# Patient Record
Sex: Male | Born: 1940 | Race: White | Hispanic: No | Marital: Married | Smoking: Light tobacco smoker
Health system: Southern US, Community
[De-identification: ages and names within clinical notes are randomized; demographics above are authoritative.]

## PROBLEM LIST (undated history)

## (undated) DIAGNOSIS — M549 Dorsalgia, unspecified: Secondary | ICD-10-CM

## (undated) DIAGNOSIS — R791 Abnormal coagulation profile: Secondary | ICD-10-CM

## (undated) DIAGNOSIS — L905 Scar conditions and fibrosis of skin: Secondary | ICD-10-CM

## (undated) DIAGNOSIS — D759 Disease of blood and blood-forming organs, unspecified: Secondary | ICD-10-CM

## (undated) DIAGNOSIS — M2550 Pain in unspecified joint: Secondary | ICD-10-CM

## (undated) DIAGNOSIS — R634 Abnormal weight loss: Secondary | ICD-10-CM

## (undated) DIAGNOSIS — J349 Unspecified disorder of nose and nasal sinuses: Secondary | ICD-10-CM

## (undated) DIAGNOSIS — R233 Spontaneous ecchymoses: Secondary | ICD-10-CM

## (undated) DIAGNOSIS — H9190 Unspecified hearing loss, unspecified ear: Secondary | ICD-10-CM

## (undated) DIAGNOSIS — R630 Anorexia: Secondary | ICD-10-CM

## (undated) DIAGNOSIS — I519 Heart disease, unspecified: Secondary | ICD-10-CM

## (undated) DIAGNOSIS — R350 Frequency of micturition: Secondary | ICD-10-CM

## (undated) DIAGNOSIS — I10 Essential (primary) hypertension: Secondary | ICD-10-CM

## (undated) DIAGNOSIS — Z8739 Personal history of other diseases of the musculoskeletal system and connective tissue: Secondary | ICD-10-CM

## (undated) DIAGNOSIS — R110 Nausea: Secondary | ICD-10-CM

## (undated) DIAGNOSIS — R262 Difficulty in walking, not elsewhere classified: Secondary | ICD-10-CM

## (undated) DIAGNOSIS — R531 Weakness: Secondary | ICD-10-CM

## (undated) DIAGNOSIS — I517 Cardiomegaly: Secondary | ICD-10-CM

## (undated) DIAGNOSIS — N429 Disorder of prostate, unspecified: Secondary | ICD-10-CM

## (undated) DIAGNOSIS — I509 Heart failure, unspecified: Secondary | ICD-10-CM

## (undated) DIAGNOSIS — C229 Malignant neoplasm of liver, not specified as primary or secondary: Secondary | ICD-10-CM

## (undated) DIAGNOSIS — R238 Other skin changes: Secondary | ICD-10-CM

## (undated) DIAGNOSIS — C801 Malignant (primary) neoplasm, unspecified: Secondary | ICD-10-CM

## (undated) HISTORY — DX: Spontaneous ecchymoses: R23.3

## (undated) HISTORY — DX: Heart disease, unspecified: I51.9

## (undated) HISTORY — DX: Malignant (primary) neoplasm, unspecified: C80.1

## (undated) HISTORY — DX: Malignant neoplasm of liver, not specified as primary or secondary: C22.9

## (undated) HISTORY — DX: Scar conditions and fibrosis of skin: L90.5

## (undated) HISTORY — DX: Heart failure, unspecified: I50.9

## (undated) HISTORY — DX: Disorder of prostate, unspecified: N42.9

## (undated) HISTORY — DX: Difficulty in walking, not elsewhere classified: R26.2

## (undated) HISTORY — DX: Abnormal weight loss: R63.4

## (undated) HISTORY — DX: Nausea: R11.0

## (undated) HISTORY — DX: Personal history of other diseases of the musculoskeletal system and connective tissue: Z87.39

## (undated) HISTORY — DX: Anorexia: R63.0

## (undated) HISTORY — PX: CARDIAC SURGERY: SHX584

## (undated) HISTORY — DX: Weakness: R53.1

## (undated) HISTORY — DX: Pain in unspecified joint: M25.50

## (undated) HISTORY — DX: Abnormal coagulation profile: R79.1

## (undated) HISTORY — DX: Other skin changes: R23.8

## (undated) HISTORY — DX: Disease of blood and blood-forming organs, unspecified: D75.9

## (undated) HISTORY — DX: Essential (primary) hypertension: I10

## (undated) HISTORY — DX: Cardiomegaly: I51.7

## (undated) HISTORY — PX: CARDIAC PACEMAKER PLACEMENT: SHX583

## (undated) HISTORY — DX: Unspecified hearing loss, unspecified ear: H91.90

## (undated) HISTORY — DX: Frequency of micturition: R35.0

## (undated) HISTORY — DX: Dorsalgia, unspecified: M54.9

## (undated) HISTORY — DX: Unspecified disorder of nose and nasal sinuses: J34.9

## (undated) HISTORY — PX: OTHER SURGICAL HISTORY: SHX169

---

## 2003-05-21 ENCOUNTER — Ambulatory Visit (HOSPITAL_COMMUNITY): Admission: RE | Admit: 2003-05-21 | Discharge: 2003-05-22 | Payer: Self-pay | Admitting: Internal Medicine

## 2003-08-27 ENCOUNTER — Inpatient Hospital Stay (HOSPITAL_COMMUNITY): Admission: RE | Admit: 2003-08-27 | Discharge: 2003-08-28 | Payer: Self-pay | Admitting: Internal Medicine

## 2003-11-12 ENCOUNTER — Ambulatory Visit: Payer: Self-pay

## 2004-01-21 ENCOUNTER — Ambulatory Visit: Payer: Self-pay | Admitting: Internal Medicine

## 2004-05-17 ENCOUNTER — Ambulatory Visit: Payer: Self-pay | Admitting: Internal Medicine

## 2004-10-11 ENCOUNTER — Ambulatory Visit: Payer: Self-pay

## 2005-02-22 ENCOUNTER — Ambulatory Visit: Payer: Self-pay | Admitting: Internal Medicine

## 2005-04-14 IMAGING — CR DG CHEST 2V
2 series · 2 of 2 positions shown · non-contrast
Comparison: none

CLINICAL DATA: Chest pain.  Smoker 1.5 packs per day for 56 years.  Precardiac catheterization. 
 CHEST (TWO VIEWS)
 PA and lateral views of the chest without previous films for comparison shows moderately severe chronic obstructive pulmonary disease with peribronchial thickening, flattening of the hemidiaphragms and increased AP diameter of the chest.   The heart is minimally prominent.  There is a dual lead pacer system which appears to be intact.  No evidence of active infiltrate, consolidation, pleural effusion or pneumothorax.  The bones appear to be within normal limits. 
 IMPRESSION
 Mild cardiomegaly, intact pacemaker system.  No evidence of acute disease.

[view not recorded (1 of 2)]
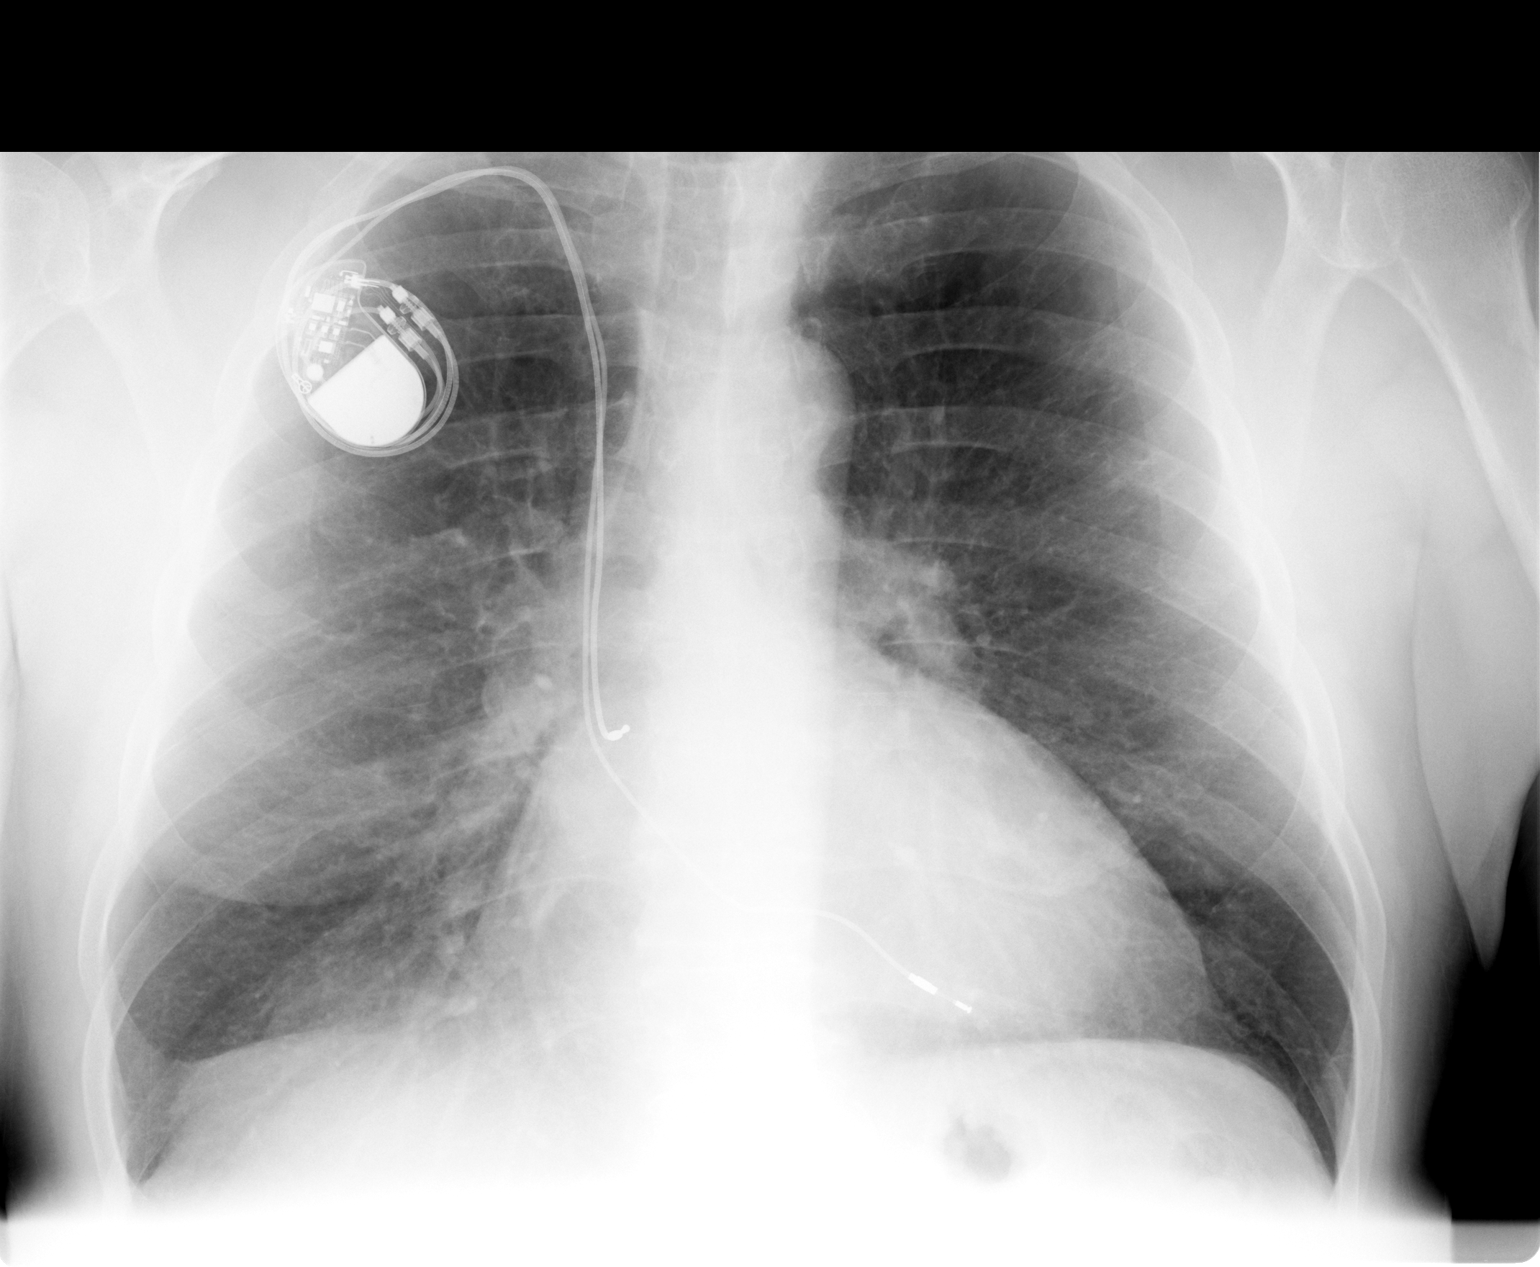

[view not recorded (2 of 2)]
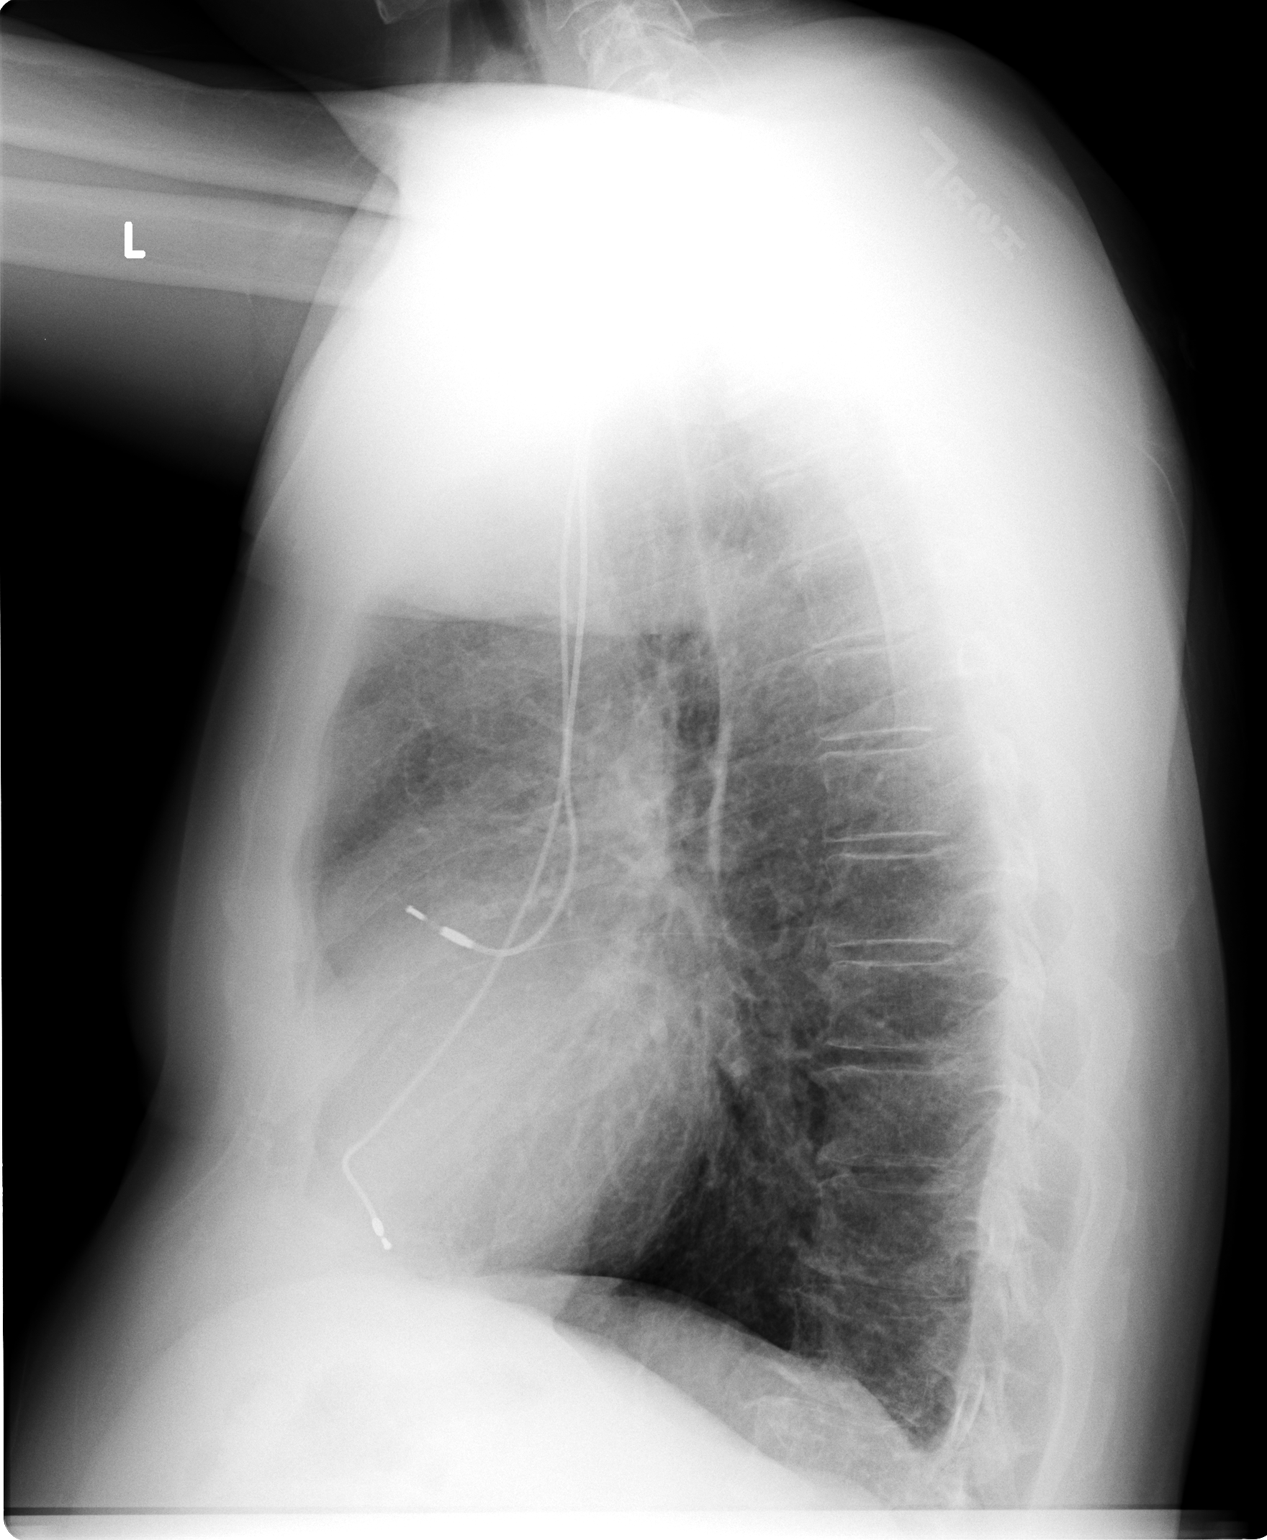

[2 of 2 positions shown; findings below may reference images not displayed]

## 2005-05-26 ENCOUNTER — Ambulatory Visit: Payer: Self-pay | Admitting: Internal Medicine

## 2005-07-21 IMAGING — CR DG CHEST 2V
2 series · 2 of 2 positions shown · non-contrast
Comparison: none

CLINICAL DATA: Left ventricular dysfunction.  
 CHEST (TWO VIEWS):
 Comparison 05/21/03.
 There is cardiomegaly present.  There is a permanent pacemaker with right atrial and right ventricular leads present which appear radiographically intact.  There are no infiltrative or edematous changes and there are no mediastinal abnormalities.
 IMPRESSION
 Cardiomegaly.  No evidence for active chest disease.

[view not recorded (1 of 2)]
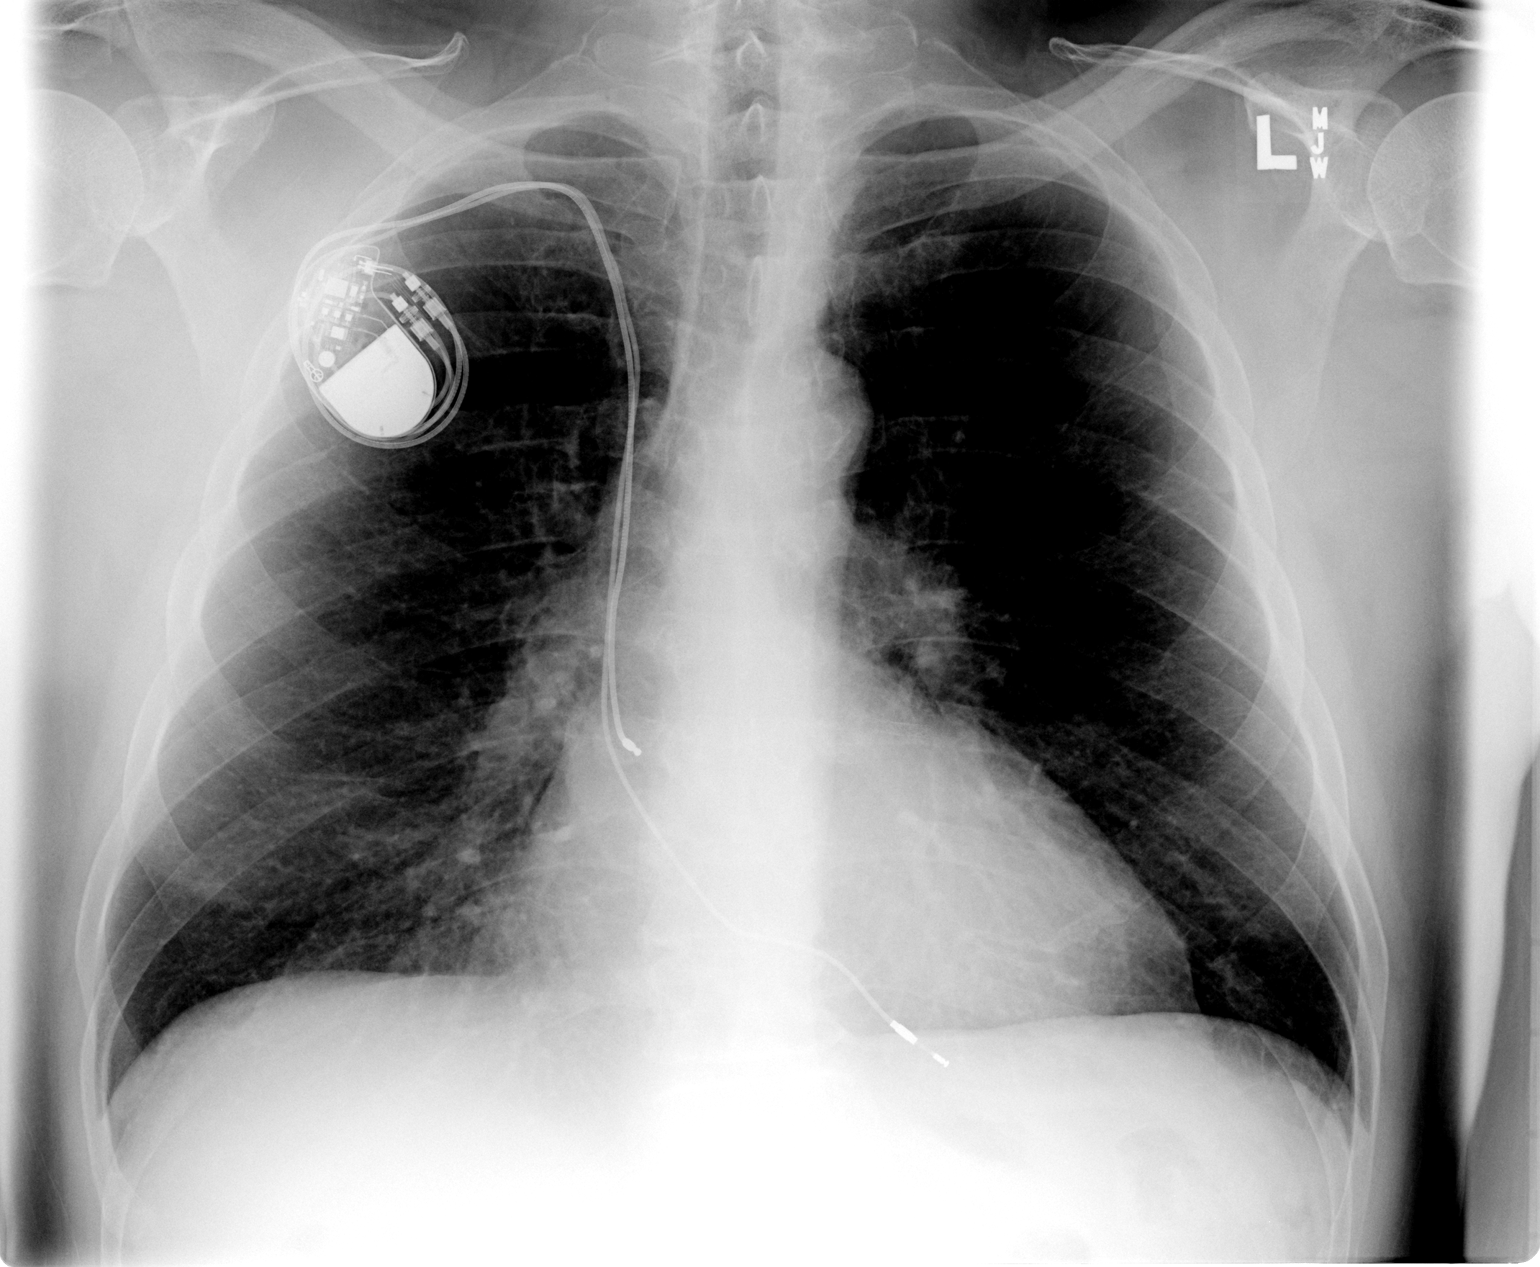

[view not recorded (2 of 2)]
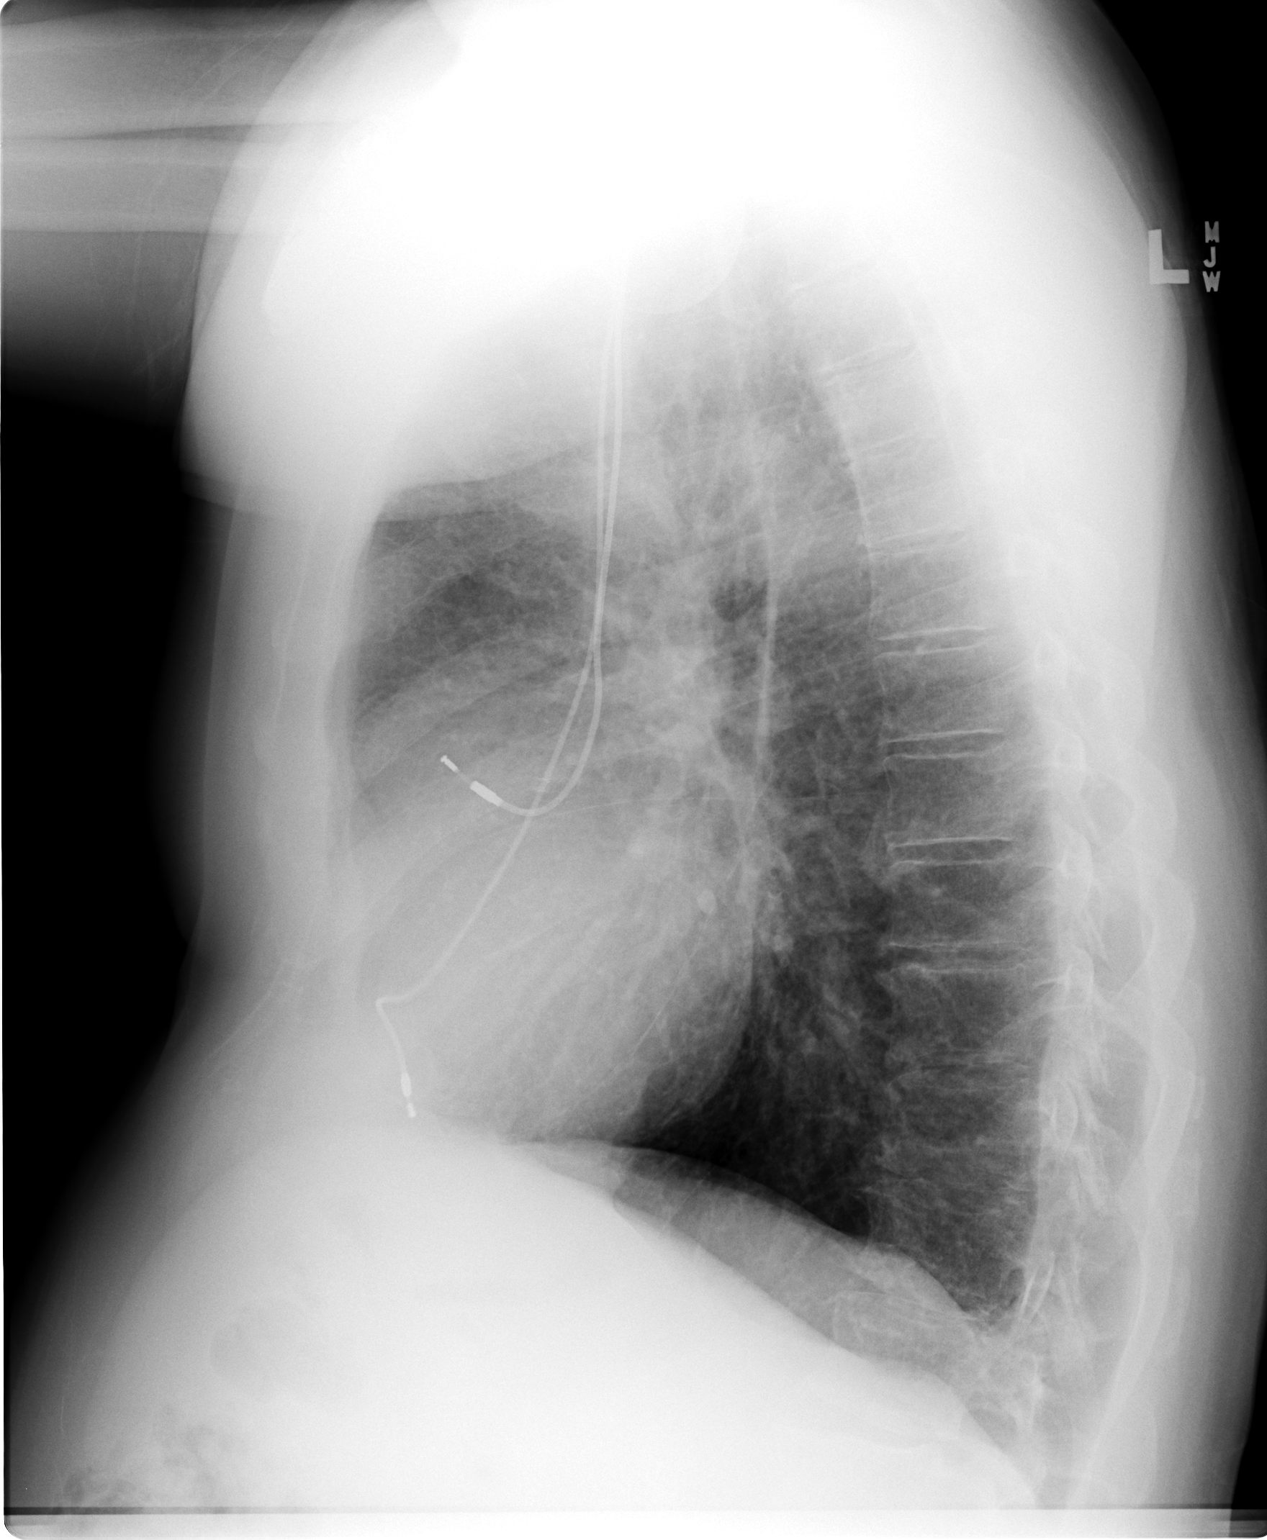

[2 of 2 positions shown; findings below may reference images not displayed]

## 2005-07-22 IMAGING — CR DG CHEST 1V PORT
1 series · 1 of 1 positions shown · non-contrast
Comparison: 08/25/03.

CLINICAL DATA: status post pacer revision.
 PORTABLE CHEST ONE VIEW (4824 hours)

[view not recorded]
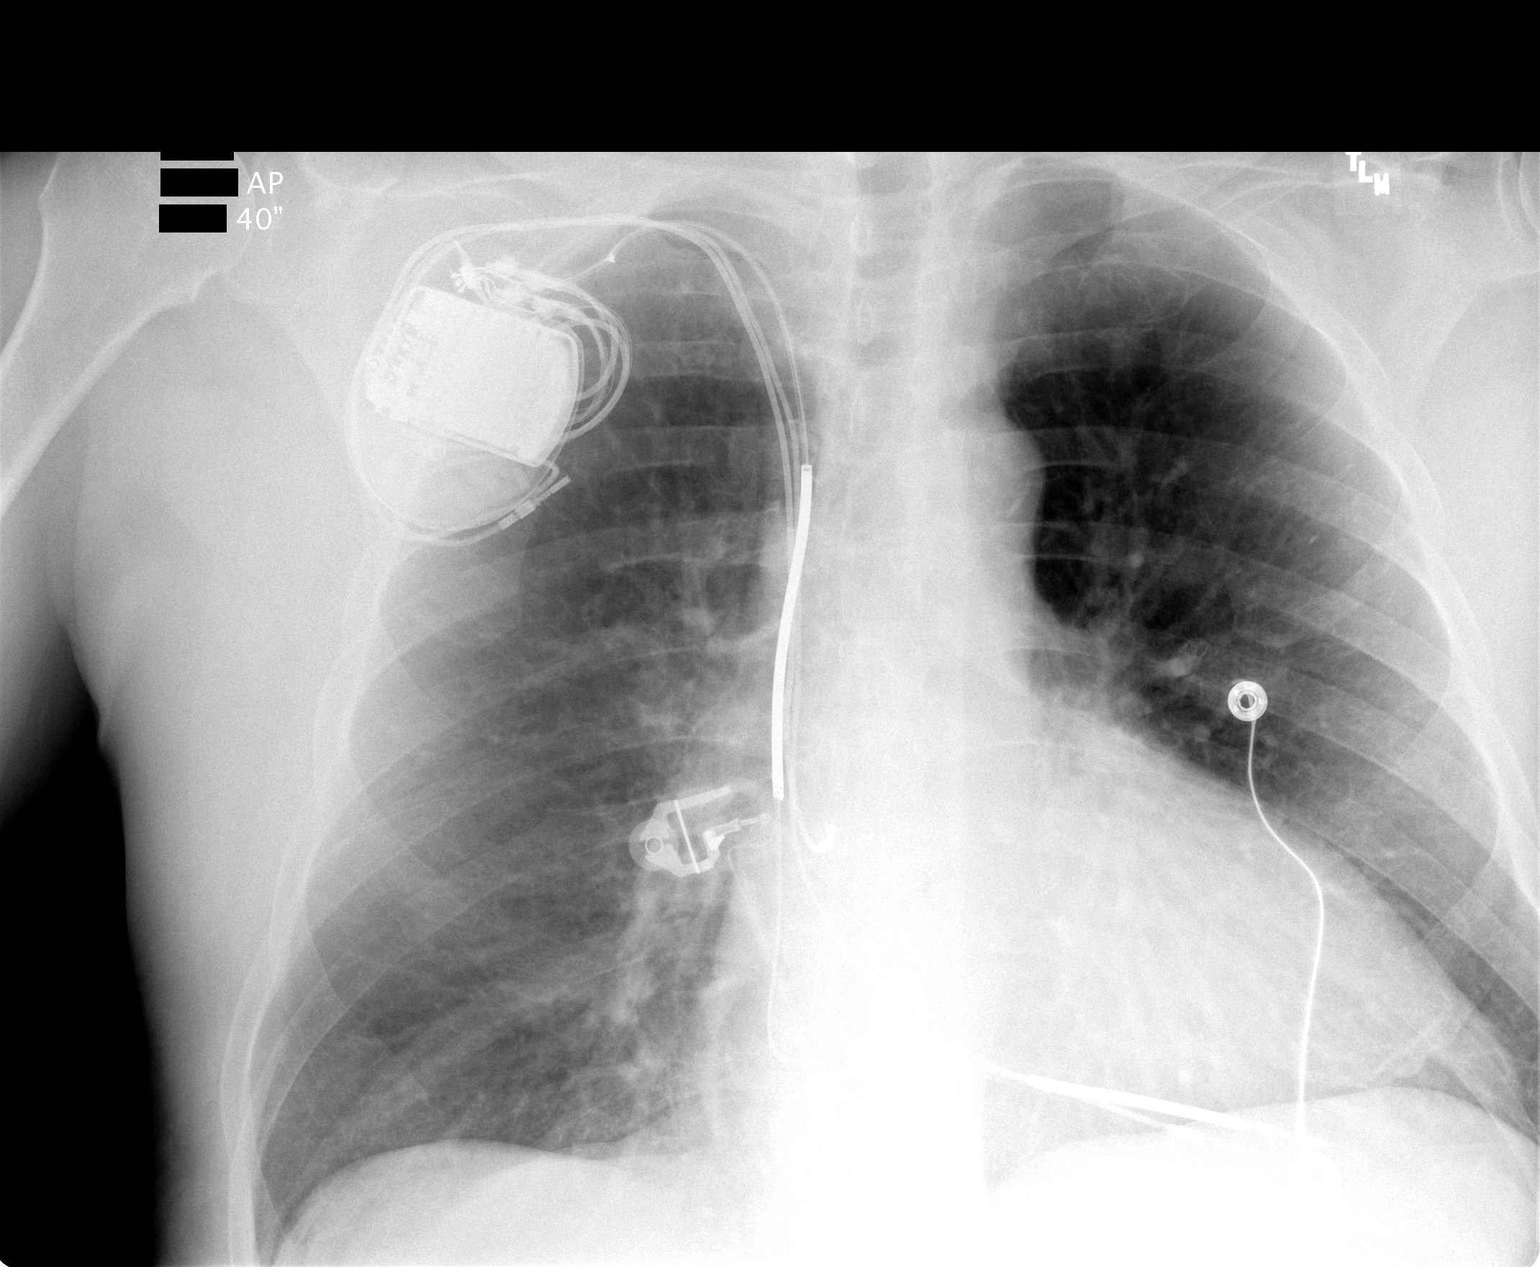

[1 of 1 positions shown; findings below may reference images not displayed]

Right subclavian pacing device has been revised with placement of a new ICD lead extending to the right ventricular apex.  No pneumothorax.  No edema or focal infiltrate.  Stable heart size.
 IMPRESSION
 No acute findings after revision of the pacing device to new ICD device.

## 2005-08-22 ENCOUNTER — Ambulatory Visit: Payer: Self-pay | Admitting: Internal Medicine

## 2005-11-24 ENCOUNTER — Ambulatory Visit: Payer: Self-pay | Admitting: Internal Medicine

## 2006-05-25 ENCOUNTER — Ambulatory Visit: Payer: Self-pay | Admitting: Internal Medicine

## 2006-11-30 ENCOUNTER — Ambulatory Visit: Payer: Self-pay | Admitting: Internal Medicine

## 2010-05-28 NOTE — Cardiovascular Report (Signed)
NAME:  YAO, HYPPOLITE NO.:  [id number]   MEDICAL RECORD NO.:  [id number]                   PATIENT TYPE:  OIB   LOCATION:  6532                                 FACILITY:  [facility]   PHYSICIAN:  Carole Binning, M.D. [facility]         DATE OF BIRTH:  Oct 04, 1940   DATE OF PROCEDURE:  05/21/2003  DATE OF DISCHARGE:  05/22/2003                              CARDIAC CATHETERIZATION   PROCEDURE PERFORMED:  Percutaneous coronary intervention with placement of a  drug-eluting stent in the proximal right coronary artery.   INDICATION:  Mr. Mangan is a 70 year old male with history of coronary  artery disease and ischemic cardiomyopathy.  He has had symptoms of  exertional angina and congestive heart failure.  Cardiac catheterization  performed earlier today by Dr. Antoine Poche revealed a very complex 95% stenosis  in the proximal right coronary artery.  He was referred for percutaneous  coronary intervention.   CATHETERIZATION PROCEDURAL NOTE:  A pre-existing 6 French sheath in the  right femoral artery was exchanged over wire for a 7 [place] sheath.  Heparin  and integrilin were administered per protocol.  We used a 6 [place] JR-4  guiding catheter.  A BMW wire was advanced under fluoroscopic guidance into  the distal right coronary artery.  Percutaneous transluminal coronary  angioplasty was then performed with a 3.25 x 15-mm Quantum balloon inflated  to 10 and 12 atmospheres.  We then positioned a 3.5 x 23-mm Cypher drug-  eluting stent across the length of disease in the right coronary artery.  The stent was deployed at 16 atmospheres.  We then went back in with a 4.0 x  12-mm Quantum balloon and inflated this to 14 atmospheres in the distal  aspect of the stent, 18 atmospheres in the mid aspect of the stent and 15  atmospheres in the proximal aspect of the stent.  Intermittent doses of  intracoronary nitroglycerin were administered.  Final angiographic images  were obtained revealing patency of the right coronary artery with 0%  residual stenosis at the stent site and TIMI-3 flow.   COMPLICATIONS:  None.   RESULTS:  Successful percutaneous transluminal coronary angioplasty with  placement of a drug-eluting stent in the proximal right coronary artery.  A  complex 95% stenosis was reduced to 0% residual with TIMI-3 flow.   PLAN:  Integrilin will be continued for 18 hours.  It is recommended that  the patient be treated with Plavix for a minimum of three months and  preferably six to 12 months.                                               Carole Binning, M.D. [facility]    MWP/MEDQ  D:  05/21/2003  T:  05/22/2003  Job:  [id number]   cc:   Danice Goltz, M.D. [facility]  Heide Guile, MD  [address]  [place]  [place] [address]  Fax: [phone]

## 2010-05-28 NOTE — Cardiovascular Report (Signed)
NAME:  Angel Morales, Angel Morales NO.:  [id number]   MEDICAL RECORD NO.:  [id number]                   PATIENT TYPE:  OIB   LOCATION:  6532                                 FACILITY:  [facility]   PHYSICIAN:  Rollene Rotunda, M.D.                DATE OF BIRTH:  Oct 13, 1940   DATE OF PROCEDURE:  05/21/2003  DATE OF DISCHARGE:                              CARDIAC CATHETERIZATION   PROCEDURE:  Left heart catheterization/coronary arteriography.   INDICATION:  Patient with unstable angina and ischemic cardiomyopathy.  He  has had multiple interventions with stenting to his LAD, circumflex and  right coronary in the past.  He has had a known EF of 25%.   PROCEDURE NOTE:  Left heart catheterization was performed via the right  femoral artery.  The artery was cannulated using anterior wall puncture.  A  #6 French arterial sheath was inserted via the modified Seldinger technique.  A preformed Judkins and a pigtail catheter were utilized.  The patient  tolerated the procedure well and left the lab in stable condition.   RESULTS:   HEMODYNAMICS:  LV 127/10, AO 127/62.   CORONARIES:  The left main was normal.  The LAD had proximal 70% stenosis  before a first septal perforator.  There was a proximal to mid stent which  was patent with luminal irregularities.  There were small first and second  diagonals which were normal.  The circumflex in the proximal AV groove had  diffuse 25% stenosis.  An OM-1 was stented and the stent was widely patent.  The right coronary artery had proximal 25% stenosis.  There was proximal 95%  followed by long 80% stenosis.  There was a distal stent before the PDA with  diffuse luminal irregularities   LEFT VENTRICULOGRAM:  The left ventriculogram was obtained in the RAO  projection.  The EF was 20-25% with antero, anteroapical and inferoapical  akinesis.   CONCLUSION:  1. Three vessel coronary disease.  2. Patent stents.  3. High grade right  coronary artery stenosis.  4. Cardiomyopathy.   PLAN:  The patient will have percutaneous revascularization of the RCA, I  will review it with Dr. Gerri Spore.                                               Rollene Rotunda, M.D.    JH/MEDQ  D:  05/21/2003  T:  05/22/2003  Job:  [id number]   cc:   Jayme Cloud, Dr.  Rosalita Levan, [place]

## 2010-05-28 NOTE — Op Note (Signed)
NAME:  Angel Morales, CULL NO.:  [id number]   MEDICAL RECORD NO.:  [id number]                   PATIENT TYPE:  INP   LOCATION:  2899                                 FACILITY:  [facility]   PHYSICIAN:  Duke Salvia, M.D.               DATE OF BIRTH:  [birth year]   DATE OF PROCEDURE:  08/27/2003  DATE OF DISCHARGE:                                 OPERATIVE REPORT   PREOPERATIVE DIAGNOSES:  1. Ischemic cardiomyopathy.  2. Prior myocardial infarction.  3. __________ left ventricular function.  4. Class II symptoms.  5. Previously implanted pacemaker with complete heartblock.   POSTOPERATIVE DIAGNOSES:  1. Ischemic cardiomyopathy.  2. Prior myocardial infarction.  3. __________ left ventricular function.  4. Class II symptoms.  5. Previously implanted pacemaker with complete heartblock.   PROCEDURE:  1. Explantation of a previously implanted pacemaker.  2. Revision of the pocket.  3. Insertion of a dual chamber defibrillator with a new defibrillation lead     and intraoperative defibrillation threshold testing.   Following the obtainment of informed consent, the patient was brought to the  electrophysiology laboratory and placed on the fluoroscopic table in the  supine position.  The procedures had contrast venography after routine prep  and drape.  Intravenous contrast was injected via the right antecubital vein  to identify the course and the patency of the extrathoracic right subclavian  vein.  This having been accomplished, lidocaine was infiltrated along line  of the previous pacemaker incision.  An incision was made and carried down  to the layer of the pacemaker pocket using sharp dissection.  The pocket was  opened, the pacemaker was freed up.  It was allowed to stay in place because  the patient was pacemaker dependent.   At this point, attention was turned to gaining access to the extrathoracic  right subclavian vein which was accomplished with  modest difficulty but  without the aspiration of air or puncturing the artery.  Ultimately, a  single venipuncture was accomplished and a 9 French tearaway introducer  sheath was placed through which was then passed a St. Jude Riata 1581 65 cm  dual coiled defibrillator lead, serial [id number].  Under fluoroscopic  guidance it was manipulated to the right ventricular apex, the bipolar  sensed paced R-wave from the  pacemaker was 11.6 with an impedance of 427,  threshold of 0.6 v and 0.5 msec with current threshold of 1.1 mA and there  was no diaphragmatic pacing at 10 v.   The previously implanted atrial lead, model 1142T, serial #[id number], was  interrogated with a P-wave of 2.6 and an impedance of 523 ohms, a pacing  threshold of 1.2 volts at 0.5 msec, current threshold was 2.7 mA.  The  ventricular lead previously implanted was not evaluated.  There was heme  throughout the insulation, it was capped and left in place.   With these acceptable parameters recorded, the lead was then  attached to a  St. Jude Atlas+ DR pulse generator model B4951161, serial [id number].  Through the  device the bipolar P-wave was 3 mV with an impedance of 465 ohms, a pacing  threshold of 1 v at 0.5 msec.  There was no intrinsic R-wave, the pacing  impedance was 415 and threshold was 0.5 v at 0.5 msec.  With these  acceptable parameters recorded, defibrillation threshold testing was  undertaken.  Ventricular fibrillation was induced via the T-wave shock.  After a total duration of 7 seconds, a joule shock was delivered through a  measured resistance of 44 ohms, terminating ventricular fibrillation and  restoring P synchronously paced rhythm.   After a wait of 5-6 minutes ventricular fibrillation was reintroduced via  the T-wave shock.  After a total duration of 6 seconds, a 15 joule shock was  delivered through a measured resistance of 42 ohms, terminating ventricular  fibrillation and restoring P synchronously paced  rhythm.  At this point the  device was implanted.  The pocket had been previously expanded to allow for  housing of the defibrillator.  The leads of the pulse generator were placed  in the pocket, hemostasis was obtained and the wound was closed in three  layers in a normal fashion.  The wound was washed, dried and a Benzoin and  Steri-Strip dressing was applied.  Needle counts, sponge counts and  instrument counts were correct at the end of the procedure according to the  staff.   The patient tolerated the procedure without apparent complication.                                               Duke Salvia, M.D.    SCK/MEDQ  D:  08/27/2003  T:  08/27/2003  Job:  [id number]   cc:   Shiv K. Harsh, M.D.  [address]  [place], [place] [address]   [facility]   Electrophysiology Laboratory

## 2010-05-28 NOTE — Discharge Summary (Signed)
NAME:  Angel Morales, HOAR NO.:  [id number]   MEDICAL RECORD NO.:  [id number]                   PATIENT TYPE:  OIB   LOCATION:  6532                                 FACILITY:  [facility]   PHYSICIAN:  Duke Salvia, M.D.               DATE OF BIRTH:  [birth year]   DATE OF ADMISSION:  05/21/2003  DATE OF DISCHARGE:                                 DISCHARGE SUMMARY   HISTORY:  This is a 70 year old gentleman whose cardiac history goes back to  1995 where he presented with complete heart block.  He underwent a St. Jude  pacemaker.  Cardiac event occurred in 2000.  He presented with heart  failure. He underwent cardiac catheterization, and ended up with complex  four-vessel PCI.  He has not been catheterized since.  He did have a  Cardiolite last April that demonstrated an ejection fraction of 25%.  Over  the last couple of months, he had recurrent heart failure manifested by  exercise intolerance.  This has been aggravated by his _________because he  has been eating at [employer] and [place].  In addition, he has also had  chest pain, specifically burning associated with exertion and cold.  The  patient was admitted to undergo cardiac catheterization followed by a bi-  ventricular ICD.   The patient underwent cardiac catheterization with an EF of 20 to 25%,  anterior apical, inferoapical akinesis.  Left main was normal.  LV proximal  70% before stent, proximal stent widely patent with luminal irregularities.  D1, D2 small.  Circumflex proximal AV groove, diffuse 25%.  OM normal widely  patent stent.  RCA proximal 25%, proximal 95% long distal stent with diffuse  luminal irregularities.  The patient with three-vessel CAD with patent  stent, high-grade RCA stenosis.  The patient underwent a PCI of his RCA.  He  tolerated the procedure well.  He had no immediate postoperative  complications and was discharged the following day in stable condition.   The patient  was discharged and will need reevaluation of his EF in  approximately eight weeks.  He was to see Dr. Duke Salvia, in 10 weeks  to reassess his need for biventricular ICD.  He was to be discharged on  Plavix for 6-12 months per his physician.   DISCHARGE MEDICATIONS:  1. Aspirin, 325 daily.  2. Plavix 75 daily for one year.  3. Coreg 25 b.i.d.  4. Lasix 40 b.i.d.  5. Zetia 10 daily.  6. Accupril 10 daily.  7. Lipitor 80 nightly.  8. Cymbalta 60 daily.  9. Aldactone 12.5 daily.  10.      Insulin as before.  11.      Nitroglycerin as needed.  12.      Tylenol one to two tablets every four to six hours as needed.   DISCHARGE INSTRUCTIONS:  1. No lifting or strenuous activity for four days, and no driving for two.  2.  He was to call if he developed lump or drainage in his groin or a     temperature above 101.  3. He was to have a 2-D echocardiogram on July 11 at 9 a.m. with Dr. Sylvie Farrier,     and see Dr. Graciela Husbands July 21 at 11:15 a.m.  4. A followup appointment was scheduled with Dr. Sylvie Farrier on May 25, at 2:45     for groin check.      Chinita Pester, C.R.N.P. [facility]                 Duke Salvia, M.D.    DS/MEDQ  D:  05/22/2003  T:  05/23/2003  Job:  [id number]   cc:   Heide Guile, MD  [address]  [place]  [place] [address]  Fax: [phone]   Duke Salvia, M.D.   Marcos Eke, Dr.  [facility]

## 2010-05-28 NOTE — Discharge Summary (Signed)
NAME:  Angel Morales, Angel Morales NO.:  [id number]   MEDICAL RECORD NO.:  [id number]                   PATIENT TYPE:  INP   LOCATION:  2902                                 FACILITY:  [facility]   PHYSICIAN:  Duke Salvia, M.D.               DATE OF BIRTH:  05-19-1940   DATE OF ADMISSION:  08/27/2003  DATE OF DISCHARGE:  08/28/2003                                 DISCHARGE SUMMARY   The patient left against medical advice on the evening of August 28, 2003.   DISCHARGE DIAGNOSES:  1. Admitted for explantation of PTVDP and implantation of implantable     cardioverter defibrillator.  2. History of myocardial infarction, ischemic cardiomyopathy, ejection     fraction by echocardiogram July 2005 25%.  3. Class II congestive heart failure symptoms.  4. Status post percutaneous coronary intervention to the right coronary     artery May 2005 and previous stents to the left anterior descending     coronary artery and the left circumflex.  5. Insulin-dependent diabetes.  6. Dyslipidemia.  7. Ongoing tobacco habituation.  8. Third degree heartblock, pacer dependent.  9. Status post St. Jude pacemaker in 1995.   PROCEDURE:  August 27, 2003:  Explantation of existent St. Jude pacemaker  with implantation of St. Jude Atlas+ DR, model V-243, serial number [id number].  He also had successful defibrillator testing in the electrophysiology  laboratory.   DISCHARGE DISPOSITION:  Angel Morales was scheduled to be discharged  August 29, 2003 after 48-hour period of watchful waiting with monitor.  The  patient is pacer-dependent and we wanted to make sure that he was stable  prior to discharge.  He also had mild swelling at the implantation site and  we wanted to make sure that that was stable before his discharge.  He was  achieving 94% oxygen saturation on room air, blood pressure was stable,  heart rate in the 70s.  He was V-pacing and P waves were evident.  He left  without  taking his follow-up orders which consisted of the pacer clinic in 2  weeks and to see Dr. Graciela Husbands in 3 months.  This office visit will be  telephoned to the patient on August 19.   He was discharged on the following medications:  1. Coreg 25 mg twice daily.  2. Lasix 40 mg twice daily.  3. Lipitor 80 mg daily at bedtime.  4. Zetia 10 mg daily.  5. Cymbalta 60 mg daily.  6. Accupril 10 mg daily.  7. Enteric-coated aspirin 325 mg daily.  8. Plavix 75 mg daily.  9. Spironolactone 12.5 mg daily.  10.      Starlix 60 mg three times daily.  11.      Humulin 70/30 units 30 units in the morning, 30 units in the     evening.  12.      Nitrostat 0.4 mg one tablet under the tongue every 5 minutes x3  as     needed for chest pain.   Activities have been discussed with the patient.  The activity sheet that he  would have taken with him he left behind.  He was told that he was to sponge  bathe until Wednesday and then he would begin showering Wednesday, August  24.  He was asked not to drive for 1 week but, of course, he did drive  himself home today.   BRIEF HISTORY:  The patient is a 69 year old male with severe ischemic  cardiomyopathy.  He underwent recent percutaneous coronary intervention of  the right coronary artery in May 2005.  He returns to see Dr. Graciela Husbands for  reassessment and consideration of implantable cardioverter defibrillator.  The patient has a previous history of depressed ejection fraction of about  25%.  Most recently, he had elective coronary angiogram revealing a high-  grade right coronary artery lesion successfully stented.  He has residual  anatomy but is notable for widely-patent proximal left anterior descending  and circumflex stent sites.  Left ventricular function was severely  depressed at 20-25%.  The patient had a follow-up echocardiogram early the  week of July 21.  This again confirmed severe left ventricular dysfunction  with ejection fraction of approximately  25%, severe global hypokinesis.  The  patient has had complete resolution of his upper chest burning and has  improved exercise capacity.  Electrocardiogram shows atrial sensing with  ventricular pacing, 70 beats per minute.  Plan:  Recommendation is to  proceed with implantable cardioverter defibrillator.  This will be done on  an elective basis and the patient will present on August 17.  The patient is  a New York Heart Association class II congestive heart failure and does not  meet criteria for CRT.  The patient will have explantation of right-sided  pacemaker, implantation of defibrillator.   HOSPITAL COURSE:  As described above, the patient was admitted to [facility] August 17 for removal of permanent pacemaker, implantation of  cardioverter defibrillator.  This procedure was done successfully on the day  of admission.  The patient was to be kept 48 hours post procedure, both to  check for stability of  the newly-installed pacing and defibrillator leads,  but also to check on the stability of the hematoma at the implantation site.  In addition, it was our plan that the patient would go home on Keflex 500 mg  three times daily for a 5-day period.  Because he left precipitously, this  has not been an order for the patient.  We will try to contact him by phone  and perhaps phone in a prescription for him on August 19.      Maple Mirza, P.A.                    Duke Salvia, M.D.    GM/MEDQ  D:  08/28/2003  T:  08/30/2003  Job:  [id number]   cc:   Heide Guile, MD  [address]  [place]  [place] [address]  Fax: [phone]

## 2010-05-28 NOTE — Assessment & Plan Note (Signed)
[facility]                           ELECTROPHYSIOLOGY OFFICE NOTE   NAME:Angel Morales, Angel Morales                MRN:          [id number]  DATE:08/22/2005                            DOB:          06/13/1940    [name] was seen today on the 13th of August 2007 in clinic for  followup of his St. Jude model number [id number] Valentina Shaggy; date of implant was August 27, 2003 for ischemic cardiomyopathy.   On interrogation of his device today, his battery voltage was 3.15 with a  charge time of 9.4 seconds.  P waves measured greater than 3 mV with an  atrial capture threshold of 1.25 volts at 0.5 msec and an atrial lead  impedance of 540 ohms.  R waves were not measured.  He is pacemaker-  dependent to a rate of 30.  Ventricular pacing threshold was 1.2 volts at  0.5 msec with a ventricular lead impedance of 355 ohms.  Shock impedance was  42.  There was 1 episode of ventricular tachycardia that was treated  effectively with ATP therapy, no other episodes noted.  No changes were made  in his parameters.   At this point, he will continue __________ on an every-5-month basis, but he  is also an [facility] patient and is deciding whether or not he is  going to follow with them as far as his defibrillator and he will let us  know.                                   Altha Harm, LPN   PO/MedQ  DD:  08/22/2005  DT:  08/23/2005  Job #:  [id number]

## 2010-09-24 ENCOUNTER — Telehealth: Payer: Self-pay | Admitting: *Deleted

## 2010-09-24 DIAGNOSIS — T82198A Other mechanical complication of other cardiac electronic device, initial encounter: Secondary | ICD-10-CM

## 2010-09-24 NOTE — Telephone Encounter (Signed)
Checking lead 

## 2012-10-18 ENCOUNTER — Ambulatory Visit (INDEPENDENT_AMBULATORY_CARE_PROVIDER_SITE_OTHER): Payer: Medicare Other

## 2012-10-18 ENCOUNTER — Encounter (INDEPENDENT_AMBULATORY_CARE_PROVIDER_SITE_OTHER): Payer: Self-pay

## 2012-10-18 VITALS — BP 118/74 | HR 98 | Resp 18 | Ht 69.0 in | Wt 200.0 lb

## 2012-10-18 DIAGNOSIS — L608 Other nail disorders: Secondary | ICD-10-CM

## 2012-10-18 DIAGNOSIS — E1149 Type 2 diabetes mellitus with other diabetic neurological complication: Secondary | ICD-10-CM

## 2012-10-18 DIAGNOSIS — E1142 Type 2 diabetes mellitus with diabetic polyneuropathy: Secondary | ICD-10-CM

## 2012-10-18 NOTE — Patient Instructions (Signed)

## 2012-10-18 NOTE — Progress Notes (Signed)
  Subjective:    Patient ID: Angel Morales, male    DOB: 04-May-1940, 72 y.o.   MRN: [id number]  HPI Patient presents at this time for followup diabetic foot and nail care. No changes in health status or medication status at this time. Nails thick brittle and criptotic 1 through 5 bilateral patient and applying Fungi-Nail to his nails as instructed.   Review of Systems  HENT: Positive for hearing loss.   Eyes: Negative.   Respiratory: Negative.   Cardiovascular: Negative.   Gastrointestinal: Negative.   Endocrine: Negative.   Genitourinary: Positive for frequency.  Allergic/Immunologic: Negative.   Neurological: Positive for dizziness, weakness and numbness.  Hematological: Bruises/bleeds easily.       Objective:   Physical Exam neurovascular status is intact and unchanged decreased epicritic sensation to the toes and forefoot bilateral and Semmes Weinstein testing. Nails criptotic incurvated and friable. Hallux nails previously avulsed. Vascular status reveals intact DP +2/4 bilateral PT one over four bilateral. Capillary refill 3 seconds all digits. Absent hair growth bilateral. No good wounds ulcerations or lacerations noted at this time.        Assessment & Plan:  Diabetes with peripheral neuropathy. Nails dystrophic Christos and incurvation 2 through 5 bilateral. Debridement of nails the presence of diabetes Contractors. Return in 3 months for followup and continued palliative care as needed.  Alvan Dame DPM

## 2013-01-24 ENCOUNTER — Ambulatory Visit (INDEPENDENT_AMBULATORY_CARE_PROVIDER_SITE_OTHER): Payer: Medicare Other

## 2013-01-24 VITALS — BP 117/68 | HR 106 | Resp 18

## 2013-01-24 DIAGNOSIS — E1149 Type 2 diabetes mellitus with other diabetic neurological complication: Secondary | ICD-10-CM

## 2013-01-24 DIAGNOSIS — E1142 Type 2 diabetes mellitus with diabetic polyneuropathy: Secondary | ICD-10-CM

## 2013-01-24 DIAGNOSIS — L608 Other nail disorders: Secondary | ICD-10-CM

## 2013-01-24 NOTE — Progress Notes (Signed)
   Subjective:    Patient ID: Angel Morales, male    DOB: 29-Jun-1940, 73 y.o.   MRN: [id number]  HPI I need my nails  And I have lost about thirty pounds due to the cancer    Review of Systems no new changes or finding     Objective:   Physical Exam Vascular status is intact with pedal pulses palpable DP +2/4 PT one over 4 bilateral Refill timed 3-4 seconds all digits. Neurologically epicritic and proprioceptive sensations intact and symmetric except for distal digits and plantar forefoot where there is absent sensation Semmes Weinstein testing. Patient also is paresthesia burning stinging sensation toes up and on palpation. Nails thick criptotic yellow discolored brittle consistent with onychomycosis patient been applying topical antifungal as instructed daily. His wife has been doing his nail care and medication applications. There are no open wounds ulcerations digital contractures however no keratoses are identified.       Assessment & Plan:  Assessment this time diabetes with history peripheral neuropathy. Debridement of nails x10 apply lumicain and Neosporin to the second digit left second and fourth digit right. Reappointed for followup with in 3 months for continued palliative care is needed  Harriet Masson DPM

## 2013-01-24 NOTE — Patient Instructions (Signed)
Diabetes and Foot Care Diabetes may cause you to have problems because of poor blood supply (circulation) to your feet and legs. This may cause the skin on your feet to become thinner, break easier, and heal more slowly. Your skin may become dry, and the skin may peel and crack. You may also have nerve damage in your legs and feet causing decreased feeling in them. You may not notice minor injuries to your feet that could lead to infections or more serious problems. Taking care of your feet is one of the most important things you can do for yourself.  HOME CARE INSTRUCTIONS  Wear shoes at all times, even in the house. Do not go barefoot. Bare feet are easily injured.  Check your feet daily for blisters, cuts, and redness. If you cannot see the bottom of your feet, use a mirror or ask someone for help.  Wash your feet with warm water (do not use hot water) and mild soap. Then pat your feet and the areas between your toes until they are completely dry. Do not soak your feet as this can dry your skin.  Apply a moisturizing lotion or petroleum jelly (that does not contain alcohol and is unscented) to the skin on your feet and to dry, brittle toenails. Do not apply lotion between your toes.  Trim your toenails straight across. Do not dig under them or around the cuticle. File the edges of your nails with an emery board or nail file.  Do not cut corns or calluses or try to remove them with medicine.  Wear clean socks or stockings every day. Make sure they are not too tight. Do not wear knee-high stockings since they may decrease blood flow to your legs.  Wear shoes that fit properly and have enough cushioning. To break in new shoes, wear them for just a few hours a day. This prevents you from injuring your feet. Always look in your shoes before you put them on to be sure there are no objects inside.  Do not cross your legs. This may decrease the blood flow to your feet.  If you find a minor scrape,  cut, or break in the skin on your feet, keep it and the skin around it clean and dry. These areas may be cleansed with mild soap and water. Do not cleanse the area with peroxide, alcohol, or iodine.  When you remove an adhesive bandage, be sure not to damage the skin around it.  If you have a wound, look at it several times a day to make sure it is healing.  Do not use heating pads or hot water bottles. They may burn your skin. If you have lost feeling in your feet or legs, you may not know it is happening until it is too late.  Make sure your health care provider performs a complete foot exam at least annually or more often if you have foot problems. Report any cuts, sores, or bruises to your health care provider immediately. SEEK MEDICAL CARE IF:   You have an injury that is not healing.  You have cuts or breaks in the skin.  You have an ingrown nail.  You notice redness on your legs or feet.  You feel burning or tingling in your legs or feet.  You have pain or cramps in your legs and feet.  Your legs or feet are numb.  Your feet always feel cold. SEEK IMMEDIATE MEDICAL CARE IF:   There is increasing redness,   swelling, or pain in or around a wound.  There is a red line that goes up your leg.  Pus is coming from a wound.  You develop a fever or as directed by your health care provider.  You notice a bad smell coming from an ulcer or wound. Document Released: 12/25/1999 Document Revised: 08/29/2012 Document Reviewed: 06/05/2012 [employer] Patient Information 2014 [employer].  

## 2013-04-25 ENCOUNTER — Ambulatory Visit (INDEPENDENT_AMBULATORY_CARE_PROVIDER_SITE_OTHER): Payer: Medicare Other

## 2013-04-25 VITALS — BP 105/61 | HR 99 | Resp 18

## 2013-04-25 DIAGNOSIS — E1149 Type 2 diabetes mellitus with other diabetic neurological complication: Secondary | ICD-10-CM

## 2013-04-25 DIAGNOSIS — L608 Other nail disorders: Secondary | ICD-10-CM

## 2013-04-25 DIAGNOSIS — E1142 Type 2 diabetes mellitus with diabetic polyneuropathy: Secondary | ICD-10-CM

## 2013-04-25 NOTE — Patient Instructions (Signed)
Diabetes and Foot Care Diabetes may cause you to have problems because of poor blood supply (circulation) to your feet and legs. This may cause the skin on your feet to become thinner, break easier, and heal more slowly. Your skin may become dry, and the skin may peel and crack. You may also have nerve damage in your legs and feet causing decreased feeling in them. You may not notice minor injuries to your feet that could lead to infections or more serious problems. Taking care of your feet is one of the most important things you can do for yourself.  HOME CARE INSTRUCTIONS  Wear shoes at all times, even in the house. Do not go barefoot. Bare feet are easily injured.  Check your feet daily for blisters, cuts, and redness. If you cannot see the bottom of your feet, use a mirror or ask someone for help.  Wash your feet with warm water (do not use hot water) and mild soap. Then pat your feet and the areas between your toes until they are completely dry. Do not soak your feet as this can dry your skin.  Apply a moisturizing lotion or petroleum jelly (that does not contain alcohol and is unscented) to the skin on your feet and to dry, brittle toenails. Do not apply lotion between your toes.  Trim your toenails straight across. Do not dig under them or around the cuticle. File the edges of your nails with an emery board or nail file.  Do not cut corns or calluses or try to remove them with medicine.  Wear clean socks or stockings every day. Make sure they are not too tight. Do not wear knee-high stockings since they may decrease blood flow to your legs.  Wear shoes that fit properly and have enough cushioning. To break in new shoes, wear them for just a few hours a day. This prevents you from injuring your feet. Always look in your shoes before you put them on to be sure there are no objects inside.  Do not cross your legs. This may decrease the blood flow to your feet.  If you find a minor scrape,  cut, or break in the skin on your feet, keep it and the skin around it clean and dry. These areas may be cleansed with mild soap and water. Do not cleanse the area with peroxide, alcohol, or iodine.  When you remove an adhesive bandage, be sure not to damage the skin around it.  If you have a wound, look at it several times a day to make sure it is healing.  Do not use heating pads or hot water bottles. They may burn your skin. If you have lost feeling in your feet or legs, you may not know it is happening until it is too late.  Make sure your health care provider performs a complete foot exam at least annually or more often if you have foot problems. Report any cuts, sores, or bruises to your health care provider immediately. SEEK MEDICAL CARE IF:   You have an injury that is not healing.  You have cuts or breaks in the skin.  You have an ingrown nail.  You notice redness on your legs or feet.  You feel burning or tingling in your legs or feet.  You have pain or cramps in your legs and feet.  Your legs or feet are numb.  Your feet always feel cold. SEEK IMMEDIATE MEDICAL CARE IF:   There is increasing redness,   swelling, or pain in or around a wound.  There is a red line that goes up your leg.  Pus is coming from a wound.  You develop a fever or as directed by your health care provider.  You notice a bad smell coming from an ulcer or wound. Document Released: 12/25/1999 Document Revised: 08/29/2012 Document Reviewed: 06/05/2012 [employer] Patient Information 2014 [employer].  

## 2013-04-25 NOTE — Progress Notes (Signed)
   Subjective:    Patient ID: KHRYSTIAN SCHAUF, male    DOB: 1940/08/13, 73 y.o.   MRN: [id number]  HPI I am here to get my toenails trimmed up    Review of Systems no systemic changes or findings noted patient is still under chemotherapy and radiation therapy is very weak in is also having hearing loss is no new change of the overall malaise and weakness.     Objective:   Physical Exam Lower extremity objective findings as follows pedal pulses DP +2/4 PT one over 4 bilateral capillary refill time 4 seconds. Epicritic and proprioceptive sensations are intact and symmetric with this distal absence of sensation to toes and forefoot. Nails thick brittle crumbly friable second through fifth bilateral having hallux nails previously avulsed there is a small spicules at identified and debridement at this time nails thick brittle, friable criptotic with discoloration and pain tenderness at times. Patient is increased hypersensitivity since his therapy as well.       Assessment & Plan:  Assessment this time is dystrophic nails debridement presence of diabetes and complicating factors return for future palliative care in 3 months for an as-needed basis. Diabetes with peripheral neuropathy and angiopathy also history of cancer with chemotherapy and radiation treatment causing some dystrophy of the skin nails as well.  Harriet Masson DPM

## 2013-07-24 ENCOUNTER — Ambulatory Visit (INDEPENDENT_AMBULATORY_CARE_PROVIDER_SITE_OTHER): Payer: Medicare Other

## 2013-07-24 VITALS — BP 96/47 | HR 97 | Resp 18

## 2013-07-24 DIAGNOSIS — E1142 Type 2 diabetes mellitus with diabetic polyneuropathy: Secondary | ICD-10-CM

## 2013-07-24 DIAGNOSIS — E1149 Type 2 diabetes mellitus with other diabetic neurological complication: Secondary | ICD-10-CM

## 2013-07-24 DIAGNOSIS — L608 Other nail disorders: Secondary | ICD-10-CM

## 2013-07-24 NOTE — Progress Notes (Signed)
   Subjective:    Patient ID: Angel Morales, male    DOB: 09-16-1940, 73 y.o.   MRN: [id number]  HPI I AM HERE TO GET MY TOENAILS TRIMMED UP    Review of Systems no new findings or systemic changes noted     Objective:   Physical Exam 73 year old white male well-developed well-nourished although he has lost weight due to cancer and chemotherapy treatments over the last several months. Is going down from 230 down to the 170 pound. Patient has lost his hair due to cancer chemotherapy and radiation treatments. Patient had lung cancer spinal cancer and liver cancer indicates all 3 are currently in remission state. Lower extremity objective findings vascular status is intact with pedal pulses palpable DP +2/4 bilateral PT plus one over 4 bilateral capillary refill timed 3-4 seconds mild +1 edema noted both ankles epicritic sensations diminished on Semmes Weinstein to forefoot digits and plantar arch nails thick criptotic incurvated 2 through 5 bilateral both hallux nails previously avulsed. Nail spicules removed and the hallux nails as well this time. No open wounds ulcerations no signs of infections the current time. Patient walking with the assistance of a walker       Assessment & Plan:  Assessment this time is history of diabetes of dystrophic friable criptotic nails 2 through 5 debridement this time return for future palliative care every 3 months as recommended continue monitor for any changes contact us is any exacerbations of nail changes are dystrophy were to occur. 3 month followup  Harriet Masson DPM

## 2013-07-24 NOTE — Patient Instructions (Signed)
Diabetes and Foot Care Diabetes may cause you to have problems because of poor blood supply (circulation) to your feet and legs. This may cause the skin on your feet to become thinner, break easier, and heal more slowly. Your skin may become dry, and the skin may peel and crack. You may also have nerve damage in your legs and feet causing decreased feeling in them. You may not notice minor injuries to your feet that could lead to infections or more serious problems. Taking care of your feet is one of the most important things you can do for yourself.  HOME CARE INSTRUCTIONS  Wear shoes at all times, even in the house. Do not go barefoot. Bare feet are easily injured.  Check your feet daily for blisters, cuts, and redness. If you cannot see the bottom of your feet, use a mirror or ask someone for help.  Wash your feet with warm water (do not use hot water) and mild soap. Then pat your feet and the areas between your toes until they are completely dry. Do not soak your feet as this can dry your skin.  Apply a moisturizing lotion or petroleum jelly (that does not contain alcohol and is unscented) to the skin on your feet and to dry, brittle toenails. Do not apply lotion between your toes.  Trim your toenails straight across. Do not dig under them or around the cuticle. File the edges of your nails with an emery board or nail file.  Do not cut corns or calluses or try to remove them with medicine.  Wear clean socks or stockings every day. Make sure they are not too tight. Do not wear knee-high stockings since they may decrease blood flow to your legs.  Wear shoes that fit properly and have enough cushioning. To break in new shoes, wear them for just a few hours a day. This prevents you from injuring your feet. Always look in your shoes before you put them on to be sure there are no objects inside.  Do not cross your legs. This may decrease the blood flow to your feet.  If you find a minor scrape,  cut, or break in the skin on your feet, keep it and the skin around it clean and dry. These areas may be cleansed with mild soap and water. Do not cleanse the area with peroxide, alcohol, or iodine.  When you remove an adhesive bandage, be sure not to damage the skin around it.  If you have a wound, look at it several times a day to make sure it is healing.  Do not use heating pads or hot water bottles. They may burn your skin. If you have lost feeling in your feet or legs, you may not know it is happening until it is too late.  Make sure your health care provider performs a complete foot exam at least annually or more often if you have foot problems. Report any cuts, sores, or bruises to your health care provider immediately. SEEK MEDICAL CARE IF:   You have an injury that is not healing.  You have cuts or breaks in the skin.  You have an ingrown nail.  You notice redness on your legs or feet.  You feel burning or tingling in your legs or feet.  You have pain or cramps in your legs and feet.  Your legs or feet are numb.  Your feet always feel cold. SEEK IMMEDIATE MEDICAL CARE IF:   There is increasing redness,   swelling, or pain in or around a wound.  There is a red line that goes up your leg.  Pus is coming from a wound.  You develop a fever or as directed by your health care provider.  You notice a bad smell coming from an ulcer or wound. Document Released: 12/25/1999 Document Revised: 08/29/2012 Document Reviewed: 06/05/2012 [employer] Patient Information 2015 [employer]. This information is not intended to replace advice given to you by your health care provider. Make sure you discuss any questions you have with your health care provider.  

## 2013-07-25 ENCOUNTER — Ambulatory Visit: Payer: Medicare Other

## 2013-10-09 ENCOUNTER — Ambulatory Visit: Payer: Medicare Other

## 2013-10-11 ENCOUNTER — Ambulatory Visit (INDEPENDENT_AMBULATORY_CARE_PROVIDER_SITE_OTHER): Payer: Medicare Other

## 2013-10-11 VITALS — BP 155/86 | HR 64 | Resp 12

## 2013-10-11 DIAGNOSIS — E1140 Type 2 diabetes mellitus with diabetic neuropathy, unspecified: Secondary | ICD-10-CM

## 2013-10-11 DIAGNOSIS — L603 Nail dystrophy: Secondary | ICD-10-CM

## 2013-10-11 NOTE — Progress Notes (Signed)
   Subjective:    Patient ID: Angel Morales, male    DOB: December 01, 1940, 73 y.o.   MRN: [id number]  HPI  ''TOENAILS TRIM.''B/L  Review of Systems no new findings or systemic changes noted     Objective:   Physical Exam Neurovascular status is unchanged patient still and and treatment for cancer normal no ecchymoses no weekly treatment that goes in for her Wednesday. Should his chemotherapy. Otherwise doing well lower extremity reveals vascular status to be intact DP +2 PT plus one over 4 bilateral capillary refill time 4 seconds mild edema plus one no varicosities noted no edema no open wounds or ulcers no secondary infections. Nails thick brittle crumbly friable dystrophic 2 through 5 bilateral having a previous nail excisions of both great toes. As far as diabetes no other changes in status appears to be well-managed he did have an episode where he his blood sugars drop and had to be benign ambulance and given IV glucose however otherwise seems to be doing stable at this time. Does usually use a wheelchair or rollabout for ambulation her activities definitely has loss of a motor function in gait and balance and stability to       Assessment & Plan:  Assessment diabetes with history peripheral neuropathy dystrophic friable criptotic nails 2 through 5 bilateral debrided return every 3 months for continued palliative care maintain appropriate accommodative shoes at all times open wounds or ulcers no secondary infection is noted  Harriet Masson DPM

## 2013-10-11 NOTE — Patient Instructions (Signed)
Diabetes and Foot Care Diabetes may cause you to have problems because of poor blood supply (circulation) to your feet and legs. This may cause the skin on your feet to become thinner, break easier, and heal more slowly. Your skin may become dry, and the skin may peel and crack. You may also have nerve damage in your legs and feet causing decreased feeling in them. You may not notice minor injuries to your feet that could lead to infections or more serious problems. Taking care of your feet is one of the most important things you can do for yourself.  HOME CARE INSTRUCTIONS  Wear shoes at all times, even in the house. Do not go barefoot. Bare feet are easily injured.  Check your feet daily for blisters, cuts, and redness. If you cannot see the bottom of your feet, use a mirror or ask someone for help.  Wash your feet with warm water (do not use hot water) and mild soap. Then pat your feet and the areas between your toes until they are completely dry. Do not soak your feet as this can dry your skin.  Apply a moisturizing lotion or petroleum jelly (that does not contain alcohol and is unscented) to the skin on your feet and to dry, brittle toenails. Do not apply lotion between your toes.  Trim your toenails straight across. Do not dig under them or around the cuticle. File the edges of your nails with an emery board or nail file.  Do not cut corns or calluses or try to remove them with medicine.  Wear clean socks or stockings every day. Make sure they are not too tight. Do not wear knee-high stockings since they may decrease blood flow to your legs.  Wear shoes that fit properly and have enough cushioning. To break in new shoes, wear them for just a few hours a day. This prevents you from injuring your feet. Always look in your shoes before you put them on to be sure there are no objects inside.  Do not cross your legs. This may decrease the blood flow to your feet.  If you find a minor scrape,  cut, or break in the skin on your feet, keep it and the skin around it clean and dry. These areas may be cleansed with mild soap and water. Do not cleanse the area with peroxide, alcohol, or iodine.  When you remove an adhesive bandage, be sure not to damage the skin around it.  If you have a wound, look at it several times a day to make sure it is healing.  Do not use heating pads or hot water bottles. They may burn your skin. If you have lost feeling in your feet or legs, you may not know it is happening until it is too late.  Make sure your health care provider performs a complete foot exam at least annually or more often if you have foot problems. Report any cuts, sores, or bruises to your health care provider immediately. SEEK MEDICAL CARE IF:   You have an injury that is not healing.  You have cuts or breaks in the skin.  You have an ingrown nail.  You notice redness on your legs or feet.  You feel burning or tingling in your legs or feet.  You have pain or cramps in your legs and feet.  Your legs or feet are numb.  Your feet always feel cold. SEEK IMMEDIATE MEDICAL CARE IF:   There is increasing redness,   swelling, or pain in or around a wound.  There is a red line that goes up your leg.  Pus is coming from a wound.  You develop a fever or as directed by your health care provider.  You notice a bad smell coming from an ulcer or wound. Document Released: 12/25/1999 Document Revised: 08/29/2012 Document Reviewed: 06/05/2012 [employer] Patient Information 2015 [employer]. This information is not intended to replace advice given to you by your health care provider. Make sure you discuss any questions you have with your health care provider.  

## 2014-01-17 ENCOUNTER — Ambulatory Visit (INDEPENDENT_AMBULATORY_CARE_PROVIDER_SITE_OTHER): Payer: Medicare Other

## 2014-01-17 VITALS — BP 139/82 | HR 103 | Resp 18

## 2014-01-17 DIAGNOSIS — E1140 Type 2 diabetes mellitus with diabetic neuropathy, unspecified: Secondary | ICD-10-CM

## 2014-01-17 DIAGNOSIS — L603 Nail dystrophy: Secondary | ICD-10-CM

## 2014-01-17 NOTE — Progress Notes (Addendum)
   Subjective:    Patient ID: Angel Morales, male    DOB: Jun 30, 1940, 74 y.o.   MRN: [id number]  HPI I AM HERE TO GET MY TOENAILS TRIMMED UP    Review of Systems no new findings or systemic changes noted     Objective:   Physical Exam Neurovascular status intact and unchanged pedal pulses palpable DP and PT plus one over 4 bilateral Refill time 3 seconds epicritic and proprioceptive sensations decreased on Semmes Weinstein to the forefoot digits and arch bilateral. No open wounds no ulcers no secondary infections. Nails criptotic incurvated and friable in tender. Patient is still on insulin is down to only 2 other medications for his fluid management.. Patient is no longer and chemotherapy or radiation therapies he is under hospice care. Patient appears very alert and oriented at this time in no significant distress. However his local cancer issues his brain tumor has resolved however continues to have cancer in his spine as well as other health issues. Walks with the assistance of a walker.       Assessment & Plan:  Assessment diabetes with peripheral neuropathy and posse mild angiopathy. No open wounds or ulcers no secondary infections dystrophic friable criptotic nails debrided 2 through 5 bilateral., return for future palliative care and diabetic foot care in 3 months as recommended should note patient has had previous nail excisions both great toes. No signs of infection no distress or discomfort.  Harriet Masson DPM

## 2014-04-18 ENCOUNTER — Ambulatory Visit: Payer: Medicare Other

## 2014-04-24 ENCOUNTER — Encounter: Payer: Self-pay | Admitting: Podiatrist

## 2014-04-24 ENCOUNTER — Ambulatory Visit (INDEPENDENT_AMBULATORY_CARE_PROVIDER_SITE_OTHER): Payer: Medicare Other | Admitting: Podiatrist

## 2014-04-24 VITALS — BP 118/63 | HR 86 | Resp 18

## 2014-04-24 DIAGNOSIS — E1140 Type 2 diabetes mellitus with diabetic neuropathy, unspecified: Secondary | ICD-10-CM | POA: Diagnosis not present

## 2014-04-24 DIAGNOSIS — L603 Nail dystrophy: Secondary | ICD-10-CM | POA: Diagnosis not present

## 2014-04-24 NOTE — Progress Notes (Signed)
HPI:  Patient presents today for follow up of foot and nail care. Denies any new complaints today.  Objective:  Patients chart is reviewed.  Vascular status reveals pedal pulses noted at 1 out of 4 dp and pt bilateral .  Neurological sensation is intact to Semmes Weinstein monofilament bilateral.  Patients nails are thickened, discolored, distrophic, friable and brittle with yellow-brown discoloration. Patient subjectively relates they are painful with shoes and with ambulation of bilateral feet.  Assessment:  Symptomatic onychomycosis  Plan:  Discussed treatment options and alternatives.  The symptomatic toenails were debrided through manual an mechanical means without complication.  Return appointment recommended at routine intervals of 3 months    Zennie Ayars, DPM   

## 2014-04-25 ENCOUNTER — Ambulatory Visit: Payer: Medicare Other

## 2014-10-11 DEATH — deceased
